# Patient Record
Sex: Female | Born: 1980 | Race: Black or African American | Hispanic: No | Smoking: Never smoker
Health system: Southern US, Community
[De-identification: ages and names within clinical notes are randomized; demographics above are authoritative.]

## PROBLEM LIST (undated history)

## (undated) DIAGNOSIS — Z789 Other specified health status: Secondary | ICD-10-CM

## (undated) HISTORY — PX: COLPOSCOPY: SHX161

## (undated) HISTORY — PX: WISDOM TOOTH EXTRACTION: SHX21

---
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
## 2001-12-04 ENCOUNTER — Inpatient Hospital Stay (HOSPITAL_COMMUNITY): Admission: AD | Admit: 2001-12-04 | Discharge: 2001-12-06 | Payer: Self-pay | Admitting: Obstetrics

## 2003-08-10 ENCOUNTER — Encounter (INDEPENDENT_AMBULATORY_CARE_PROVIDER_SITE_OTHER): Payer: Self-pay | Admitting: *Deleted

## 2003-08-10 LAB — CONVERTED CEMR LAB

## 2003-08-20 ENCOUNTER — Encounter: Admission: RE | Admit: 2003-08-20 | Discharge: 2003-08-20 | Payer: Self-pay | Admitting: Family Medicine

## 2003-08-20 ENCOUNTER — Other Ambulatory Visit: Admission: RE | Admit: 2003-08-20 | Discharge: 2003-08-20 | Payer: Self-pay | Admitting: Family Medicine

## 2005-02-25 ENCOUNTER — Other Ambulatory Visit: Admission: RE | Admit: 2005-02-25 | Discharge: 2005-02-25 | Payer: Self-pay | Admitting: Obstetrics and Gynecology

## 2006-05-14 ENCOUNTER — Emergency Department (HOSPITAL_COMMUNITY): Admission: EM | Admit: 2006-05-14 | Discharge: 2006-05-14 | Payer: Self-pay | Admitting: Emergency Medicine

## 2006-11-04 ENCOUNTER — Encounter (INDEPENDENT_AMBULATORY_CARE_PROVIDER_SITE_OTHER): Payer: Self-pay | Admitting: *Deleted

## 2006-12-14 ENCOUNTER — Inpatient Hospital Stay (HOSPITAL_COMMUNITY): Admission: AD | Admit: 2006-12-14 | Discharge: 2006-12-14 | Payer: Self-pay | Admitting: Obstetrics & Gynecology

## 2007-03-31 ENCOUNTER — Ambulatory Visit: Payer: Self-pay | Admitting: Gynecology

## 2007-03-31 ENCOUNTER — Other Ambulatory Visit: Admission: RE | Admit: 2007-03-31 | Discharge: 2007-03-31 | Payer: Self-pay | Admitting: Gynecology

## 2007-04-12 ENCOUNTER — Ambulatory Visit: Payer: Self-pay | Admitting: Obstetrics & Gynecology

## 2007-07-07 ENCOUNTER — Inpatient Hospital Stay (HOSPITAL_COMMUNITY): Admission: AD | Admit: 2007-07-07 | Discharge: 2007-07-07 | Payer: Self-pay | Admitting: Obstetrics and Gynecology

## 2007-08-24 ENCOUNTER — Ambulatory Visit: Payer: Self-pay | Admitting: Gynecology

## 2007-11-28 ENCOUNTER — Ambulatory Visit: Payer: Self-pay | Admitting: Obstetrics and Gynecology

## 2010-01-16 ENCOUNTER — Ambulatory Visit: Payer: Self-pay | Admitting: Obstetrics and Gynecology

## 2010-01-16 ENCOUNTER — Other Ambulatory Visit: Admission: RE | Admit: 2010-01-16 | Discharge: 2010-01-16 | Payer: Self-pay | Admitting: Obstetrics and Gynecology

## 2010-02-04 ENCOUNTER — Ambulatory Visit: Payer: Self-pay | Admitting: Obstetrics & Gynecology

## 2010-02-18 ENCOUNTER — Ambulatory Visit: Payer: Self-pay | Admitting: Obstetrics & Gynecology

## 2010-02-21 ENCOUNTER — Emergency Department (HOSPITAL_COMMUNITY): Admission: EM | Admit: 2010-02-21 | Discharge: 2010-02-21 | Payer: Self-pay | Admitting: Emergency Medicine

## 2010-06-07 ENCOUNTER — Emergency Department (HOSPITAL_COMMUNITY)
Admission: EM | Admit: 2010-06-07 | Discharge: 2010-06-07 | Payer: Self-pay | Source: Home / Self Care | Admitting: Emergency Medicine

## 2010-11-23 LAB — POCT URINALYSIS DIP (DEVICE)
Glucose, UA: NEGATIVE mg/dL
Nitrite: NEGATIVE
Specific Gravity, Urine: 1.015 (ref 1.005–1.030)
Urobilinogen, UA: 0.2 mg/dL (ref 0.0–1.0)

## 2010-11-23 LAB — POCT PREGNANCY, URINE: Preg Test, Ur: NEGATIVE

## 2010-11-24 LAB — POCT PREGNANCY, URINE: Preg Test, Ur: NEGATIVE

## 2011-02-08 LAB — ANTIBODY SCREEN: Antibody Screen: NEGATIVE

## 2011-02-08 LAB — HEPATITIS B SURFACE ANTIGEN: Hepatitis B Surface Ag: NEGATIVE

## 2011-02-08 LAB — ABO/RH

## 2011-02-08 LAB — RUBELLA ANTIBODY, IGM: Rubella: IMMUNE

## 2011-02-08 LAB — RPR: RPR: NONREACTIVE

## 2011-02-08 LAB — HIV ANTIBODY (ROUTINE TESTING W REFLEX): HIV: NONREACTIVE

## 2011-04-19 ENCOUNTER — Other Ambulatory Visit: Payer: Self-pay | Admitting: Family Medicine

## 2011-04-19 DIAGNOSIS — Z3689 Encounter for other specified antenatal screening: Secondary | ICD-10-CM

## 2011-05-03 ENCOUNTER — Ambulatory Visit (HOSPITAL_COMMUNITY)
Admission: RE | Admit: 2011-05-03 | Discharge: 2011-05-03 | Disposition: A | Discharge status: Home / Self Care | Payer: Medicaid Other | Source: Ambulatory Visit | Attending: Family Medicine | Admitting: Family Medicine

## 2011-05-03 DIAGNOSIS — Z363 Encounter for antenatal screening for malformations: Secondary | ICD-10-CM | POA: Insufficient documentation

## 2011-05-03 DIAGNOSIS — O358XX0 Maternal care for other (suspected) fetal abnormality and damage, not applicable or unspecified: Secondary | ICD-10-CM | POA: Insufficient documentation

## 2011-05-03 DIAGNOSIS — Z1389 Encounter for screening for other disorder: Secondary | ICD-10-CM | POA: Insufficient documentation

## 2011-05-03 DIAGNOSIS — Z3689 Encounter for other specified antenatal screening: Secondary | ICD-10-CM

## 2011-05-07 ENCOUNTER — Ambulatory Visit (HOSPITAL_COMMUNITY)
Admission: RE | Admit: 2011-05-07 | Discharge: 2011-05-07 | Disposition: A | Discharge status: Home / Self Care | Payer: Medicaid Other | Source: Ambulatory Visit | Attending: Family Medicine | Admitting: Family Medicine

## 2011-05-11 NOTE — Progress Notes (Signed)
Genetic Counseling  High-Risk Gestation Note  Appointment Date:  05/11/2011 Referred By: Morgan Bores, MD Date of Birth:  11-16-1980  I met with [name] for prenatal genetic counseling given a family history of Down syndrome.   Both family histories were reviewed and found to be contributory  for Down syndrome.  [name] reported two maternal half-nephews (her maternal half-brother's sons) with Down syndrome (full siblings to each other). The type of Down syndrome was not known by the patient. One passed away at 7 months from unspecified heart complications, and the other half-nephew with Down syndrome is now 29 years of age, reported to be doing well. [name] reports that he is followed by specialists at [facility]. We reviewed chromosomes, nondisjunction and translocations. She was counseled that approximately 95% of Down syndrome occurs by nondisjunction and is sporadic. This would not increase the risk for Down syndrome for her pregnancy above that of her age related risk if this were the cause for Down syndrome in her relatives. It was also discussed that approximately less than 5% of the time Down syndrome can be associated with a translocation. The patient was counseled that a balanced translocation can be inherited throughout generations, and if inherited, can increase the risk of having a child with Down syndrome. Without further information regarding chromosomal analysis for the patient's half-nephews, an accurate genetic risk cannot be calculated. We discussed that we are available to review medical records for her relatives, if desired, regarding their chromosome analysis in order to assess recurrence risk for relatives. [name] was also offered karyotyping for herself to assess for an the presence of a translocation. She declined at this time. The patient was encouraged to contact us should she obtain additional information regarding her nephews' Down syndrome. Further  genetic counseling is warranted if more information is obtained. Without further information regarding the provided family history, an accurate genetic risk cannot be calculated.   Further genetic counseling is warranted if more information is obtained.   We reviewed available screening options of Quad screen and ultrasound, both of which were previously performed for the patient. She understands that screening tests are used to modify a patient's a priori risk for aneuploidy, typically based on age.  This estimate provides a pregnancy specific risk assessment and is not diagnostic. We reviewed the results of the patient's Quad screen. [name] a priori risk for Down syndrome based on her age alone was reported as approximately 1 in 72,059. Her Quad screen was reported as screen negative. However, her age-related risk was not able to be adjusted given the reported family history of Down syndrome. This screen was also screen negative for trisomy 18 and open neural tube defects. We also reviewed the availability of diagnostic options including amniocentesis.  A risk of 1 in 200-300 was given for amniocentesis, the primary complication being spontaneous pregnancy loss.  We discussed the risks, limitations, and benefits of each.  It was discussed that not all birth defects or genetic conditions can be detected by these procedures.  After reviewing these options, [name] elected ultrasound and Quad screen only and declined amniocentesis.   She expressed that she is not interested in pursuing diagnostic testing at this time or in the future, given the associated risk of complications. She understands that ultrasound and Quad screen cannot rule out all birth defects or genetic syndromes.    The patient denied exposure to environmental toxins or chemical agents.  She denied the use of  alcohol, tobacco or street drugs.  She denied significant viral illnesses during the course of her pregnancy.  Her medical  and surgical history were contributory for a tooth abscess during pregnancy, for which she was treated with penicillin.     We counseled the patient for approximately 25 minutes regarding the above risks.     Morgan Braun Quianna Avery, MS, [facility] 05/11/2011

## 2011-05-31 LAB — POCT URINALYSIS DIP (DEVICE)
Bilirubin Urine: NEGATIVE
Glucose, UA: NEGATIVE
Nitrite: NEGATIVE
Specific Gravity, Urine: 1.025
pH: 6

## 2011-06-11 LAB — POCT URINALYSIS DIP (DEVICE)
Bilirubin Urine: NEGATIVE
Glucose, UA: NEGATIVE
Ketones, ur: NEGATIVE
Protein, ur: 30 — AB
Specific Gravity, Urine: 1.025
Urobilinogen, UA: 0.2
pH: 6

## 2011-06-16 LAB — URINALYSIS, ROUTINE W REFLEX MICROSCOPIC
Ketones, ur: NEGATIVE
Nitrite: NEGATIVE
Protein, ur: NEGATIVE

## 2011-06-21 LAB — POCT PREGNANCY, URINE: Operator id: 297281

## 2011-09-07 NOTE — L&D Delivery Note (Addendum)
Delivery Note At 10:43 PM a viable and healthy female was delivered via Vaginal, Spontaneous Delivery (Presentation:vertex ;  ).  APGAR: 8, 9; weight 8 lb 10 oz (3912 g).   Placenta status: Intact, Spontaneous.  Cord: 3 vessel with the following complications: .  Cord pH: not indicated  Delivery was complicated by a posterior compound hand that delivered spontaneously without intervention. Placenta delivered spontaneously and intact. Bleeding controlled.  Dr Candelaria Celeste, DO, present during delivery.  Anesthesia: None  Episiotomy: none Lacerations: 1st degree, no repair necessary Suture Repair: not indicated Est. Blood Loss (mL):   Mom to postpartum.  Baby to nursery-stable.  Cameron Proud 09/23/2011, 11:02 PM   I attended and precepted the delivery.  I agree with the above note. Candelaria Celeste JEHIEL 09/23/2011 11:46 PM

## 2011-09-09 LAB — STREP B DNA PROBE: GBS: POSITIVE

## 2011-09-23 ENCOUNTER — Encounter (HOSPITAL_COMMUNITY): Payer: Self-pay

## 2011-09-23 ENCOUNTER — Inpatient Hospital Stay (HOSPITAL_COMMUNITY)
Admission: AD | Admit: 2011-09-23 | Discharge: 2011-09-23 | Disposition: A | Discharge status: Home / Self Care | Source: Ambulatory Visit | Attending: Obstetrics & Gynecology | Admitting: Obstetrics & Gynecology

## 2011-09-23 ENCOUNTER — Encounter (HOSPITAL_COMMUNITY): Payer: Self-pay | Admitting: *Deleted

## 2011-09-23 ENCOUNTER — Inpatient Hospital Stay (HOSPITAL_COMMUNITY)
Admission: AD | Admit: 2011-09-23 | Discharge: 2011-09-25 | DRG: 775 | Disposition: A | Discharge status: Home / Self Care | Payer: Medicaid Other | Source: Ambulatory Visit | Attending: Obstetrics & Gynecology | Admitting: Obstetrics & Gynecology

## 2011-09-23 DIAGNOSIS — O9989 Other specified diseases and conditions complicating pregnancy, childbirth and the puerperium: Secondary | ICD-10-CM

## 2011-09-23 DIAGNOSIS — O328XX0 Maternal care for other malpresentation of fetus, not applicable or unspecified: Secondary | ICD-10-CM | POA: Diagnosis present

## 2011-09-23 DIAGNOSIS — Z22330 Carrier of Group B streptococcus: Secondary | ICD-10-CM

## 2011-09-23 DIAGNOSIS — O99892 Other specified diseases and conditions complicating childbirth: Principal | ICD-10-CM | POA: Diagnosis present

## 2011-09-23 DIAGNOSIS — O99891 Other specified diseases and conditions complicating pregnancy: Secondary | ICD-10-CM | POA: Insufficient documentation

## 2011-09-23 HISTORY — DX: Other specified health status: Z78.9

## 2011-09-23 LAB — CBC
Platelets: 198 10*3/uL (ref 150–400)
RDW: 14.3 % (ref 11.5–15.5)
WBC: 8.2 10*3/uL (ref 4.0–10.5)

## 2011-09-23 MED ORDER — IBUPROFEN 600 MG PO TABS
600.0000 mg | ORAL_TABLET | Freq: Four times a day (QID) | ORAL | Status: DC | PRN
  Administered 2011-09-23: 600 mg via ORAL
  Filled 2011-09-23: qty 1

## 2011-09-23 MED ORDER — LIDOCAINE HCL (PF) 1 % IJ SOLN: 30.0000 mL | INTRAMUSCULAR | Status: DC | PRN

## 2011-09-23 MED ORDER — FENTANYL CITRATE 0.05 MG/ML IJ SOLN: 100.0000 ug | INTRAMUSCULAR | Status: DC | PRN

## 2011-09-23 MED ORDER — LACTATED RINGERS IV SOLN
INTRAVENOUS | Status: DC
  Administered 2011-09-23: 23:00:00 via INTRAVENOUS

## 2011-09-23 MED ORDER — CITRIC ACID-SODIUM CITRATE 334-500 MG/5ML PO SOLN: 30.0000 mL | ORAL | Status: DC | PRN

## 2011-09-23 MED ORDER — OXYTOCIN BOLUS FROM INFUSION
500.0000 mL | Freq: Once | INTRAVENOUS | Status: AC
  Administered 2011-09-23: 500 mL via INTRAVENOUS

## 2011-09-23 MED ORDER — FLEET ENEMA 7-19 GM/118ML RE ENEM: 1.0000 | ENEMA | RECTAL | Status: DC | PRN

## 2011-09-23 MED ORDER — LACTATED RINGERS IV SOLN: 500.0000 mL | INTRAVENOUS | Status: DC | PRN

## 2011-09-23 MED ORDER — SODIUM CHLORIDE 0.9 % IV SOLN
2.0000 g | Freq: Once | INTRAVENOUS | Status: AC
  Filled 2011-09-23: qty 2000

## 2011-09-23 MED ORDER — OXYTOCIN 20 UNITS IN LACTATED RINGERS INFUSION - SIMPLE
125.0000 mL/h | Freq: Once | INTRAVENOUS | Status: AC
  Administered 2011-09-23: 125 mL/h via INTRAVENOUS

## 2011-09-23 MED ORDER — OXYCODONE-ACETAMINOPHEN 5-325 MG PO TABS: 2.0000 | ORAL_TABLET | ORAL | Status: DC | PRN

## 2011-09-23 MED ORDER — LIDOCAINE HCL (PF) 1 % IJ SOLN
INTRAMUSCULAR | Status: AC
  Filled 2011-09-23: qty 30

## 2011-09-23 MED ORDER — ONDANSETRON HCL 4 MG/2ML IJ SOLN: 4.0000 mg | Freq: Four times a day (QID) | INTRAMUSCULAR | Status: DC | PRN

## 2011-09-23 MED ORDER — SODIUM CHLORIDE 0.9 % IV SOLN
2.0000 g | Freq: Four times a day (QID) | INTRAVENOUS | Status: DC
  Administered 2011-09-23: 2 g via INTRAVENOUS
  Filled 2011-09-23 (×4): qty 2000

## 2011-09-23 MED ORDER — ACETAMINOPHEN 325 MG PO TABS: 650.0000 mg | ORAL_TABLET | ORAL | Status: DC | PRN

## 2011-09-23 MED ORDER — OXYTOCIN 20 UNITS IN LACTATED RINGERS INFUSION - SIMPLE
INTRAVENOUS | Status: AC
  Administered 2011-09-23: 125 mL/h via INTRAVENOUS
  Filled 2011-09-23: qty 1000

## 2011-09-23 MED ORDER — OXYTOCIN 10 UNIT/ML IJ SOLN
INTRAMUSCULAR | Status: AC
  Filled 2011-09-23: qty 2

## 2011-09-23 NOTE — Progress Notes (Signed)
Pt presents via EMS with contractions and ROM, clear fluid on perineum, SVE 8-9. G3P2. TO 168 per bed. Dr Carron Brazen notified to meet pt in room

## 2011-09-23 NOTE — H&P (Signed)
Morgan Hart is a 31 y.o. female presenting for SOL. Maternal Medical History:  Reason for admission: Reason for admission: contractions.  Contractions: Onset was 3-5 hours ago.   Frequency: regular.   Duration is approximately 30 seconds.    Fetal activity: Perceived fetal activity is normal.   Last perceived fetal movement was within the past 12 hours.      OB History    Grav Para Term Preterm Abortions TAB SAB Ect Mult Living   3 2 2       2      Past Medical History  Diagnosis Date  . No pertinent past medical history    Past Surgical History  Procedure Date  . Colposcopy    Family History: family history is not on file. Social History:  reports that she has never smoked. She does not have any smokeless tobacco history on file. She reports that she does not drink alcohol or use illicit drugs.  ROS  Dilation: 8.5 Station: 0 Exam by:: topp,RN There were no vitals taken for this visit. Maternal Exam:  Uterine Assessment: Contraction strength is firm.  Contraction duration is 30 seconds. Contraction frequency is regular.   Abdomen: Fundal height is term.   Estimated fetal weight is 8#.   Fetal presentation: vertex  Introitus: Ferning test: not done.  Nitrazine test: not done. Amniotic fluid character: not assessed.  Pelvis: adequate for delivery.   Cervix: Cervix evaluated by digital exam.     Fetal Exam Fetal Monitor Review: Baseline rate: 160.  Variability: moderate (6-25 bpm).   Pattern: no decelerations.    Fetal State Assessment: Category I - tracings are normal.     Physical Exam  Constitutional: She is oriented to person, place, and time. She appears well-developed and well-nourished.  HENT:  Head: Normocephalic and atraumatic.  Respiratory: Effort normal.  GI: She exhibits no distension and no mass. There is no tenderness. There is no rebound and no guarding.  Neurological: She is alert and oriented to person, place, and time.  Psychiatric:  She has a normal mood and affect. Her behavior is normal. Judgment and thought content normal.    Prenatal labs: ABO, Rh:  O+ Antibody:  neg Rubella:  immune RPR:   nonreactive HBsAg:   neg HIV:   nonreactive GBS:   positive 1Hr Gtt: 151 3hr GTT: 70, 155, 163, 129  Assessment/Plan: Precipitous delivery upon arrival in L&D, see delivery note.  Ampicillin 2g IV given prior to delivery.  Will transfer to postpartum after recovery.  Morgan Hart JEHIEL 09/23/2011, 10:35 PM

## 2011-09-23 NOTE — Plan of Care (Signed)
Problem: Consults Goal: Birthing Suites Patient Information Press F2 to bring up selections list  Outcome: Completed/Met Date Met:  09/23/11  Pt 37-[redacted] weeks EGA

## 2011-09-23 NOTE — ED Provider Notes (Signed)
Morgan Hart is a 31 y.o. year old G80P2002 female at [redacted]w[redacted]d weeks gestation who presents to [facility] reporting spontaneous rupture of membranes. She reports one episode of leaking a 7x7cm spot of fluid, unsure of color. No leaking since. She reports pos FM, mild irreg UC's and denies VB.   OB History    Grav Para Term Preterm Abortions TAB SAB Ect Mult Living   3 2 2       2      Past Medical History  Diagnosis Date  . No pertinent past medical history    Past Surgical History  Procedure Date  . Colposcopy     History   Social History  . Marital Status: Divorced    Spouse Name: N/A    Number of Children: N/A  . Years of Education: N/A   Occupational History  . Not on file.   Social History Main Topics  . Smoking status: Never Smoker   . Smokeless tobacco: Not on file  . Alcohol Use: No  . Drug Use: No  . Sexually Active: Yes   Other Topics Concern  . Not on file   Social History Narrative  . No narrative on file   No family history on file.  ROS: Otherwise neg  VSS, AF  PE: NAD, A&Ox4 Abd: Soft, NT Pelvic: Small amount of white discharge on perineum. Neg pool, neg fern on sterile spec exam. No rash.  Cervix; 3/70/-2, vtx.  FHR: 140-150, moderate variability, 15x15 accels, no decels Toco: Q10+, mild  Assessment: 1. Intact  Plan: 1. D/C home 2. Labor precautions, FKCs 3. F/U at [facility] as schleduled  Dorathy Kinsman 09/23/2011 02:58 PM

## 2011-09-23 NOTE — Progress Notes (Signed)
Gush of clear fluid this morning, no odor.  Lost mucous plug in shower.  No fluid since. irreg ctx's for past wk, have gotten more consistant today. Was 2 2/50 on Tues.

## 2011-09-24 MED ORDER — WITCH HAZEL-GLYCERIN EX PADS: 1.0000 "application " | MEDICATED_PAD | CUTANEOUS | Status: DC | PRN

## 2011-09-24 MED ORDER — LANOLIN HYDROUS EX OINT: TOPICAL_OINTMENT | CUTANEOUS | Status: DC | PRN

## 2011-09-24 MED ORDER — ONDANSETRON HCL 4 MG PO TABS: 4.0000 mg | ORAL_TABLET | ORAL | Status: DC | PRN

## 2011-09-24 MED ORDER — DIPHENHYDRAMINE HCL 25 MG PO CAPS: 25.0000 mg | ORAL_CAPSULE | Freq: Four times a day (QID) | ORAL | Status: DC | PRN

## 2011-09-24 MED ORDER — ONDANSETRON HCL 4 MG/2ML IJ SOLN: 4.0000 mg | INTRAMUSCULAR | Status: DC | PRN

## 2011-09-24 MED ORDER — PRENATAL MULTIVITAMIN CH
1.0000 | ORAL_TABLET | Freq: Every day | ORAL | Status: DC
  Administered 2011-09-24 – 2011-09-25 (×2): 1 via ORAL
  Filled 2011-09-24 (×2): qty 1

## 2011-09-24 MED ORDER — DIBUCAINE 1 % RE OINT: 1.0000 "application " | TOPICAL_OINTMENT | RECTAL | Status: DC | PRN

## 2011-09-24 MED ORDER — BENZOCAINE-MENTHOL 20-0.5 % EX AERO: 1.0000 "application " | INHALATION_SPRAY | CUTANEOUS | Status: DC | PRN

## 2011-09-24 MED ORDER — SENNOSIDES-DOCUSATE SODIUM 8.6-50 MG PO TABS
2.0000 | ORAL_TABLET | Freq: Every day | ORAL | Status: DC
  Administered 2011-09-24: 2 via ORAL

## 2011-09-24 MED ORDER — SIMETHICONE 80 MG PO CHEW: 80.0000 mg | CHEWABLE_TABLET | ORAL | Status: DC | PRN

## 2011-09-24 MED ORDER — OXYCODONE-ACETAMINOPHEN 5-325 MG PO TABS: 1.0000 | ORAL_TABLET | ORAL | Status: DC | PRN

## 2011-09-24 MED ORDER — ZOLPIDEM TARTRATE 5 MG PO TABS: 5.0000 mg | ORAL_TABLET | Freq: Every evening | ORAL | Status: DC | PRN

## 2011-09-24 MED ORDER — IBUPROFEN 600 MG PO TABS
600.0000 mg | ORAL_TABLET | Freq: Four times a day (QID) | ORAL | Status: DC
  Administered 2011-09-24 – 2011-09-25 (×5): 600 mg via ORAL
  Filled 2011-09-24 (×6): qty 1

## 2011-09-24 MED ORDER — TETANUS-DIPHTH-ACELL PERTUSSIS 5-2.5-18.5 LF-MCG/0.5 IM SUSP: 0.5000 mL | Freq: Once | INTRAMUSCULAR | Status: DC

## 2011-09-24 NOTE — Progress Notes (Signed)
UR Chart review completed.  

## 2011-09-24 NOTE — Discharge Summary (Signed)
Obstetric Discharge Summary Reason for Admission: onset of labor Prenatal Procedures: none Intrapartum Procedures: spontaneous vaginal delivery, GBS prophylaxis Postpartum Procedures: none Complications-Operative and Postpartum: 1st degree perineal laceration Hemoglobin  Date Value Range Status  09/23/2011 12.9  12.0-15.0 (g/dL) Final     HCT  Date Value Range Status  09/23/2011 37.7  36.0-46.0 (%) Final    Discharge Diagnoses: Term Pregnancy-delivered  Discharge Information: Date: 09/24/2011 Activity: unrestricted and pelvic rest Diet: routine Medications: PNV and Ibuprofen Condition: stable Instructions: refer to practice specific booklet Discharge to: home   Newborn Data: Live born female  Birth Weight: 8 lb 10 oz (3912 g) APGAR: 8, 9  Home with mother.  Cameron Proud 09/24/2011, 9:42 PM

## 2011-09-24 NOTE — Progress Notes (Signed)
Post Partum Day 1  Subjective: no complaints, up ad lib, voiding, tolerating PO and + flatus Breast feeding initiated, going well, mom to work with lactation team today Pt denies any fever or pain   Objective: Blood pressure 117/71, pulse 80, temperature 97.8 F (36.6 C), temperature source Oral, resp. rate 18, height 5\' 8"  (1.727 m), weight 99.338 kg (219 lb), SpO2 98.00%, unknown if currently breastfeeding.  Physical Exam:  General: alert, cooperative, appears stated age and no distress Lochia: appropriate Uterine Fundus: firm Incision: none DVT Evaluation: No evidence of DVT seen on physical exam. Negative Homan's sign. No cords or calf tenderness. No significant calf/ankle edema. Lungs CTA bilaterally Heart RRR no mgr   Basename 09/23/11 2310  HGB 12.9  HCT 37.7    Assessment/Plan: Plan for discharge tomorrow, Breastfeeding, Lactation consult and Contraception patient expresses desire for Mirena IUD Baby GBS (+) - anticipate DC tomorrow   LOS: 1 day   Morgan Hart 09/24/2011, 8:09 AM

## 2011-09-25 NOTE — Progress Notes (Signed)
Post Partum Day2 Subjective: no complaints, up ad lib, voiding, tolerating PO, + flatus and stooling Baby feeding well. Denies CP, SHOB, Leg pain.   Objective: Blood pressure 119/76, pulse 80, temperature 98.9 F (37.2 C), temperature source Oral, resp. rate 20, height 5\' 8"  (1.727 m), weight 99.338 kg (219 lb), SpO2 99.00%, unknown if currently breastfeeding.  Physical Exam:  General: alert, cooperative, appears stated age and no distress Lochia: appropriate Uterine Fundus: firm DVT Evaluation: No evidence of DVT seen on physical exam. Negative Homan's sign. No cords or calf tenderness. No significant calf/ankle edema. Lungs CTA bilaterally, no wheeze/rhonchi/rales Heart RRR no murmur, gallop, rub   Basename 09/23/11 2310  HGB 12.9  HCT 37.7    Assessment/Plan: Discharge home, Breastfeeding and Contraception Mirena at 6wk followup   LOS: 2 days   Cameron Proud 09/25/2011, 7:31 AM

## 2011-09-28 NOTE — ED Provider Notes (Signed)
Agree with above note.  LEGGETT,KELLY H. 09/28/2011 3:33 PM  

## 2014-07-08 ENCOUNTER — Encounter (HOSPITAL_COMMUNITY): Payer: Self-pay

## 2014-09-21 ENCOUNTER — Emergency Department (HOSPITAL_COMMUNITY)
Admission: EM | Admit: 2014-09-21 | Discharge: 2014-09-22 | Disposition: A | Discharge status: Home / Self Care | Payer: Medicaid Other | Attending: Emergency Medicine | Admitting: Emergency Medicine

## 2014-09-21 ENCOUNTER — Encounter (HOSPITAL_COMMUNITY): Payer: Self-pay

## 2014-09-21 DIAGNOSIS — Y998 Other external cause status: Secondary | ICD-10-CM | POA: Diagnosis not present

## 2014-09-21 DIAGNOSIS — Y92410 Unspecified street and highway as the place of occurrence of the external cause: Secondary | ICD-10-CM | POA: Insufficient documentation

## 2014-09-21 DIAGNOSIS — S3992XA Unspecified injury of lower back, initial encounter: Secondary | ICD-10-CM | POA: Diagnosis present

## 2014-09-21 DIAGNOSIS — T148XXA Other injury of unspecified body region, initial encounter: Secondary | ICD-10-CM

## 2014-09-21 DIAGNOSIS — S39012A Strain of muscle, fascia and tendon of lower back, initial encounter: Secondary | ICD-10-CM | POA: Insufficient documentation

## 2014-09-21 DIAGNOSIS — Z79899 Other long term (current) drug therapy: Secondary | ICD-10-CM | POA: Insufficient documentation

## 2014-09-21 DIAGNOSIS — Y9389 Activity, other specified: Secondary | ICD-10-CM | POA: Diagnosis not present
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
## 2014-09-22 MED ORDER — CYCLOBENZAPRINE HCL 10 MG PO TABS: 10.0000 mg | ORAL_TABLET | Freq: Two times a day (BID) | ORAL | Status: DC | PRN

## 2014-09-22 MED ORDER — IBUPROFEN 800 MG PO TABS: 800.0000 mg | ORAL_TABLET | Freq: Three times a day (TID) | ORAL | Status: DC
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
## 2015-02-05 ENCOUNTER — Encounter (HOSPITAL_COMMUNITY): Payer: Self-pay | Admitting: Emergency Medicine

## 2015-02-05 ENCOUNTER — Emergency Department (HOSPITAL_COMMUNITY)
Admission: EM | Admit: 2015-02-05 | Discharge: 2015-02-05 | Disposition: A | Discharge status: Home / Self Care | Payer: Medicaid Other | Attending: Emergency Medicine | Admitting: Emergency Medicine

## 2015-02-05 DIAGNOSIS — Z79899 Other long term (current) drug therapy: Secondary | ICD-10-CM | POA: Diagnosis not present

## 2015-02-05 DIAGNOSIS — Z791 Long term (current) use of non-steroidal anti-inflammatories (NSAID): Secondary | ICD-10-CM | POA: Diagnosis not present

## 2015-02-05 DIAGNOSIS — R9431 Abnormal electrocardiogram [ECG] [EKG]: Secondary | ICD-10-CM | POA: Diagnosis not present

## 2015-02-05 DIAGNOSIS — R200 Anesthesia of skin: Secondary | ICD-10-CM | POA: Diagnosis present

## 2015-02-05 LAB — BASIC METABOLIC PANEL
Anion gap: 9 (ref 5–15)
BUN: 10 mg/dL (ref 6–20)
CALCIUM: 9 mg/dL (ref 8.9–10.3)
CO2: 27 mmol/L (ref 22–32)
Chloride: 100 mmol/L — ABNORMAL LOW (ref 101–111)
Creatinine, Ser: 0.81 mg/dL (ref 0.44–1.00)
GFR calc Af Amer: >60 mL/min (ref >60)
GLUCOSE: 118 mg/dL — AB (ref 65–99)
POTASSIUM: 3.6 mmol/L (ref 3.5–5.1)
SODIUM: 136 mmol/L (ref 135–145)

## 2015-02-05 LAB — CBC
HCT: 35.8 % — ABNORMAL LOW (ref 36.0–46.0)
Hemoglobin: 11.8 g/dL — ABNORMAL LOW (ref 12.0–15.0)
MCH: 30.2 pg (ref 26.0–34.0)
MCHC: 33 g/dL (ref 30.0–36.0)
MCV: 91.6 fL (ref 78.0–100.0)
PLATELETS: 303 10*3/uL (ref 150–400)
RBC: 3.91 MIL/uL (ref 3.87–5.11)
RDW: 13.3 % (ref 11.5–15.5)
WBC: 6.4 10*3/uL (ref 4.0–10.5)

## 2015-02-05 LAB — I-STAT TROPONIN, ED: TROPONIN I, POC: 0 ng/mL (ref 0.00–0.08)

## 2015-02-05 NOTE — ED Provider Notes (Signed)
CSN: [id number]     Arrival date & time 02/05/15  1958 History   First MD Initiated Contact with Patient 02/05/15 2101     Chief Complaint  Patient presents with  . Numbness     (Consider location/radiation/quality/duration/timing/severity/associated sxs/prior Treatment) HPI  Morgan Hart is a 34 y.o. female with no second past medical history presenting today with abnormal EKG. Patient was in a clinical course and they're performing EKGs on each other. Her EKG read possible left atrial enlargement. In addition she's had left arm numbness for the past 3 days. Intermittently goes to her foot. So she presents emergency department for evaluation of the EKG. She denies any chest pain, shortness of breath, emesis, diaphoresis. Nothing makes the symptoms better or worse. She denies any history of ACS or blood clots in the past. She's had no recent long distance travel or surgeries. Patient has no further complaints.  10 Systems reviewed and are negative for acute change except as noted in the HPI.    Past Medical History  Diagnosis Date  . No pertinent past medical history    Past Surgical History  Procedure Laterality Date  . Colposcopy     History reviewed. No pertinent family history. History  Substance Use Topics  . Smoking status: Never Smoker   . Smokeless tobacco: Not on file  . Alcohol Use: No   OB History    Gravida Para Term Preterm AB TAB SAB Ectopic Multiple Living   Review of Systems    Allergies  Review of patient's allergies indicates no known allergies.  Home Medications   Prior to Admission medications   Medication Sig Start Date End Date Taking? Authorizing Provider  Multiple Vitamin (MULTI VITAMIN DAILY PO) Take 1 tablet by mouth daily.   Yes Historical Provider, MD  Probiotic Product (PROBIOTIC DAILY PO) Take 1 capsule by mouth daily.   Yes Historical Provider, MD  cyclobenzaprine (FLEXERIL) 10 MG tablet Take 1 tablet (10 mg total)  by mouth 2 (two) times daily as needed for muscle spasms. Patient not taking: Reported on 02/05/2015 09/22/14   Elpidio Anis, PA-C  ibuprofen (ADVIL,MOTRIN) 800 MG tablet Take 1 tablet (800 mg total) by mouth 3 (three) times daily. Patient not taking: Reported on 02/05/2015 09/22/14   Elpidio Anis, PA-C   BP 130/83 mmHg  Pulse 86  Temp(Src) 98.2 F (36.8 C) (Oral)  Resp 22  SpO2 100%  LMP 01/29/2015 Physical Exam  Constitutional: She is oriented to person, place, and time. She appears well-developed and well-nourished. No distress.  HENT:  Head: Normocephalic and atraumatic.  Nose: Nose normal.  Mouth/Throat: Oropharynx is clear and moist. No oropharyngeal exudate.  Eyes: Conjunctivae and EOM are normal. Pupils are equal, round, and reactive to light. No scleral icterus.  Neck: Normal range of motion. Neck supple. No JVD present. No tracheal deviation present. No thyromegaly present.  Cardiovascular: Normal rate, regular rhythm and normal heart sounds.  Exam reveals no gallop and no friction rub.   No murmur heard. Pulmonary/Chest: Effort normal and breath sounds normal. No respiratory distress. She has no wheezes. She exhibits no tenderness.  Abdominal: Soft. Bowel sounds are normal. She exhibits no distension and no mass. There is no tenderness. There is no rebound and no guarding.  Musculoskeletal: Normal range of motion. She exhibits no edema or tenderness.  Lymphadenopathy:    She has no cervical adenopathy.  Neurological: She is alert  and oriented to person, place, and time. No cranial nerve deficit. She exhibits normal muscle tone.  Skin: Skin is warm and dry. No rash noted. She is not diaphoretic. No erythema. No pallor.  Nursing note and vitals reviewed.   ED Course  Procedures (including critical care time) Labs Review Labs Reviewed  CBC - Abnormal; Notable for the following:    Hemoglobin 11.8 (*)    HCT 35.8 (*)    All other components within normal limits  BASIC  METABOLIC PANEL - Abnormal; Notable for the following:    Chloride 100 (*)    Glucose, Bld 118 (*)    All other components within normal limits  I-STAT TROPOININ, ED    Imaging Review No results found.   EKG Interpretation   Date/Time:  Wednesday February 05 2015 20:05:12 EDT Ventricular Rate:  83 PR Interval:  165 QRS Duration: 79 QT Interval:  359 QTC Calculation: 422 R Axis:   62 Text Interpretation:  Sinus rhythm Probable left atrial enlargement  Confirmed by Erroll Lunani, Albie Arizpe Ayokunle [phone]) on 02/05/2015 9:11:30 PM      MDM   Final diagnoses:  None    Patient presents emergency department for evaluation of her EKG. Repeat EKG here does show probable left atrial enlargement as well. EKG findings were explained to the patient in detail. Also EKG she brought and shows similar signs. Will provide neurology follow-up for numbness of left upper extremity. I do not believe these are related.  Heart score is less than 3, patient is PERC negative. She is low risk for cardiac event. Patient overall appears well in no acute distress, her vital signs remain within her normal limits and she is safe for discharge.    Tomasita CrumbleAdeleke Tereasa Yilmaz, MD 02/05/15 2210

## 2015-02-05 NOTE — Discharge Instructions (Signed)
Electrocardiography Morgan Hart, your EKG shows a normal variant. See cardiology within 3 days for close follow-up. You're also given neurology for follow-up for your numbness within 3 days. If any symptoms worsen come back to the emergency department immediately. Thank you. Electrocardiography is a test to check the heart. It looks at how your heart beats. It is done if:   You are having a heart attack.  You may have had a heart attack in the past.  You are having a health checkup. PROCEDURE  This test is easy and painless.  You will remove your clothes from the waist up, wear a hospital gown, and lie down on your back.  Small round pads (electrodes) will be placed on your chest, arms, and legs. The round pads are attached to wires that go to a machine. The machine records the electrical activity of your heart.  You will be asked to relax and lie very still. AFTER THE PROCEDURE  If the test was done as part of a routine exam, you can go back to your normal activity as told by your doctor.  Your results will be looked at by a heart doctor (cardiologist).  Ask when your test results will be ready. Make sure you get your test results. Document Released: 08/05/2008 Document Revised: 01/07/2014 Document Reviewed: 01/03/2012 [facility] Patient Information 2015 [employer] [place]. This information is not intended to replace advice given to you by your health care provider. Make sure you discuss any questions you have with your health care provider.

## 2015-02-05 NOTE — ED Notes (Signed)
Pt states she was at school today and they were practicing EKG's on each other and her said she had some left atrial enlargement. States it made her nervous because for the past three days her left arm and leg have been intermittently numb and tingling. With her left arm being more weak than her leg. Denies chest pain/SOB/N/V/D/fever or chills.

## 2015-02-05 NOTE — ED Notes (Signed)
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

## 2015-03-21 ENCOUNTER — Ambulatory Visit (INDEPENDENT_AMBULATORY_CARE_PROVIDER_SITE_OTHER): Admitting: Neurology

## 2015-03-21 ENCOUNTER — Encounter: Payer: Self-pay | Admitting: Neurology

## 2015-03-21 VITALS — BP 110/80 | HR 82 | Wt 193.4 lb

## 2015-03-21 DIAGNOSIS — M542 Cervicalgia: Secondary | ICD-10-CM

## 2015-03-21 DIAGNOSIS — R200 Anesthesia of skin: Secondary | ICD-10-CM | POA: Diagnosis not present

## 2015-03-21 NOTE — Patient Instructions (Addendum)
1.  MRI brain wwo contrast and cervical spine wo contrast 2.  If no improvement of your symptoms, we may consider electrodiagnostic testing of the left side 3. We will call you with the results

## 2015-03-21 NOTE — Progress Notes (Signed)
Note sent
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
## 2015-03-25 ENCOUNTER — Other Ambulatory Visit: Payer: Self-pay | Admitting: Neurology

## 2015-03-31 ENCOUNTER — Telehealth: Payer: Self-pay | Admitting: Neurology

## 2015-03-31 ENCOUNTER — Other Ambulatory Visit

## 2015-03-31 ENCOUNTER — Ambulatory Visit
Admission: RE | Admit: 2015-03-31 | Discharge: 2015-03-31 | Disposition: A | Discharge status: Home / Self Care | Payer: Medicaid Other | Source: Ambulatory Visit | Attending: Neurology | Admitting: Neurology

## 2015-03-31 ENCOUNTER — Telehealth: Payer: Self-pay | Admitting: *Deleted

## 2015-03-31 DIAGNOSIS — R200 Anesthesia of skin: Secondary | ICD-10-CM

## 2015-03-31 DIAGNOSIS — M542 Cervicalgia: Secondary | ICD-10-CM

## 2015-03-31 MED ORDER — GADOBENATE DIMEGLUMINE 529 MG/ML IV SOLN
18.0000 mL | Freq: Once | INTRAVENOUS | Status: AC | PRN
  Administered 2015-03-31: 18 mL via INTRAVENOUS

## 2015-03-31 NOTE — Telephone Encounter (Signed)
Left message for patient to let her know that insurance will not cover the MRI C-spine so I cancelled it.  The MRI brain is still scheduled.

## 2015-03-31 NOTE — Telephone Encounter (Signed)
Pt called in regards to her insurance for her MRI and said she no longer has [employer]are, she only has Medicaid/Dawn CB# [phone]

## 2015-04-01 NOTE — Telephone Encounter (Signed)
Noted  

## 2016-11-01 ENCOUNTER — Other Ambulatory Visit (HOSPITAL_COMMUNITY): Payer: Self-pay | Admitting: *Deleted

## 2016-11-01 DIAGNOSIS — N6320 Unspecified lump in the left breast, unspecified quadrant: Secondary | ICD-10-CM

## 2016-11-23 ENCOUNTER — Ambulatory Visit (HOSPITAL_COMMUNITY)
Admission: RE | Admit: 2016-11-23 | Discharge: 2016-11-23 | Disposition: A | Discharge status: Home / Self Care | Payer: Self-pay | Source: Ambulatory Visit | Attending: Obstetrics and Gynecology | Admitting: Obstetrics and Gynecology

## 2016-11-23 ENCOUNTER — Ambulatory Visit
Admission: RE | Admit: 2016-11-23 | Discharge: 2016-11-23 | Disposition: A | Discharge status: Home / Self Care | Payer: No Typology Code available for payment source | Source: Ambulatory Visit | Attending: Obstetrics and Gynecology | Admitting: Obstetrics and Gynecology

## 2016-11-23 ENCOUNTER — Other Ambulatory Visit (HOSPITAL_COMMUNITY): Payer: Self-pay | Admitting: *Deleted

## 2016-11-23 ENCOUNTER — Encounter (HOSPITAL_COMMUNITY): Payer: Self-pay

## 2016-11-23 ENCOUNTER — Other Ambulatory Visit (HOSPITAL_COMMUNITY): Payer: Self-pay | Admitting: Obstetrics and Gynecology

## 2016-11-23 VITALS — BP 112/72 | Temp 98.6°F | Ht 68.0 in | Wt 193.6 lb

## 2016-11-23 DIAGNOSIS — N6320 Unspecified lump in the left breast, unspecified quadrant: Secondary | ICD-10-CM

## 2016-11-23 DIAGNOSIS — N644 Mastodynia: Secondary | ICD-10-CM

## 2016-11-23 DIAGNOSIS — R2231 Localized swelling, mass and lump, right upper limb: Secondary | ICD-10-CM

## 2016-11-23 DIAGNOSIS — Z1239 Encounter for other screening for malignant neoplasm of breast: Secondary | ICD-10-CM

## 2016-11-23 NOTE — Progress Notes (Addendum)
Complaints of a lump underneath her left breast that has increased in size and pain in her upper left breast. Patient states the pain comes and goes. Patient rates the pain at a 5 out of 10.  Pap Smear: Pap smear not completed today. [facility] and normal. Per patient has a history of an abnormal Pap smear around 2008 or 2009 that a colposcopy was completed for follow up. Patient states all Pap smears have been normal since colposcopy. No Pap smear results are in EPIC.  Physical exam: Breasts Breasts symmetrical. No skin abnormalities bilateral breasts. No nipple retraction bilateral breasts. No nipple discharge bilateral breasts. No lymphadenopathy. No lumps palpated bilateral breasts. Palpated a fatty mass underneath left breast. Patient complained of left upper breast tenderness on exam. [facility] and possible left breast ultrasound. Appointment scheduled for Tuesday, November 23, 2016 at 1450.        Pelvic/Bimanual No Pap smear completed today since last Pap smear was 07/23/2016. Pap smear not indicated per BCCCP guidelines.   Smoking History: Patient has never smoked.  Patient Navigation: Patient education provided. [facility] services [facility] through [facility].

## 2016-11-23 NOTE — Patient Instructions (Addendum)
Explained breast self awareness with Saraphina Gruenwald. Patient did not need a Pap smear today due to last Pap smear was 07/23/2016. Let her know BCCCP will cover Pap smears every since patient has had at least three normal Pap smears since abnormal Pap smear. [facility] and possible left breast ultrasound. Appointment scheduled for Tuesday, November 23, 2016 at 1450.   Deby Thibault verbalized understanding.  Marshall Roehrich, Kathaleen Maserhristine Poll, RN 3:02 PM

## 2016-11-25 NOTE — Addendum Note (Signed)
Encounter addended by: Priscille Heidelberghristine P Brannock, RN on: 11/25/2016  9:57 AM<BR>    Actions taken: Follow-up modified, Sign clinical note

## 2016-11-30 ENCOUNTER — Encounter (HOSPITAL_COMMUNITY): Payer: Self-pay | Admitting: *Deleted

## 2019-01-30 ENCOUNTER — Encounter (HOSPITAL_COMMUNITY): Payer: Self-pay

## 2019-01-30 ENCOUNTER — Other Ambulatory Visit: Payer: Self-pay

## 2019-01-30 ENCOUNTER — Emergency Department (HOSPITAL_COMMUNITY): Payer: Self-pay

## 2019-01-30 ENCOUNTER — Emergency Department (HOSPITAL_COMMUNITY)
Admission: EM | Admit: 2019-01-30 | Discharge: 2019-01-30 | Disposition: A | Discharge status: Home / Self Care | Payer: Self-pay | Attending: Emergency Medicine | Admitting: Emergency Medicine

## 2019-01-30 DIAGNOSIS — Z79899 Other long term (current) drug therapy: Secondary | ICD-10-CM | POA: Insufficient documentation

## 2019-01-30 DIAGNOSIS — R0789 Other chest pain: Secondary | ICD-10-CM | POA: Insufficient documentation

## 2019-01-30 LAB — BASIC METABOLIC PANEL
Anion gap: 5 (ref 5–15)
BUN: 10 mg/dL (ref 6–20)
CO2: 28 mmol/L (ref 22–32)
Calcium: 8.9 mg/dL (ref 8.9–10.3)
Chloride: 105 mmol/L (ref 98–111)
Creatinine, Ser: 1.07 mg/dL — ABNORMAL HIGH (ref 0.44–1.00)
GFR calc Af Amer: >60 mL/min (ref >60)
GFR calc non Af Amer: >60 mL/min (ref >60)
Glucose, Bld: 83 mg/dL (ref 70–99)
Potassium: 3.4 mmol/L — ABNORMAL LOW (ref 3.5–5.1)
Sodium: 138 mmol/L (ref 135–145)

## 2019-01-30 LAB — CBC
HCT: 36 % (ref 36.0–46.0)
Hemoglobin: 11.5 g/dL — ABNORMAL LOW (ref 12.0–15.0)
MCH: 29.7 pg (ref 26.0–34.0)
MCHC: 31.9 g/dL (ref 30.0–36.0)
MCV: 93 fL (ref 80.0–100.0)
Platelets: 340 10*3/uL (ref 150–400)
RBC: 3.87 MIL/uL (ref 3.87–5.11)
RDW: 14.2 % (ref 11.5–15.5)
WBC: 6.2 10*3/uL (ref 4.0–10.5)
nRBC: 0 % (ref 0.0–0.2)

## 2019-01-30 LAB — TROPONIN I: Troponin I: <0.03 ng/mL (ref <0.03)

## 2019-01-30 LAB — D-DIMER, QUANTITATIVE: D-Dimer, Quant: 0.77 ug/mL-FEU — ABNORMAL HIGH (ref 0.00–0.50)

## 2019-01-30 MED ORDER — SODIUM CHLORIDE (PF) 0.9 % IJ SOLN
INTRAMUSCULAR | Status: AC
  Filled 2019-01-30: qty 50

## 2019-01-30 MED ORDER — IOHEXOL 350 MG/ML SOLN
100.0000 mL | Freq: Once | INTRAVENOUS | Status: AC | PRN
  Administered 2019-01-30: 100 mL via INTRAVENOUS

## 2019-01-30 MED ORDER — KETOROLAC TROMETHAMINE 30 MG/ML IJ SOLN
30.0000 mg | Freq: Once | INTRAMUSCULAR | Status: DC
  Filled 2019-01-30: qty 1

## 2019-01-30 MED ORDER — SODIUM CHLORIDE 0.9% FLUSH
3.0000 mL | Freq: Once | INTRAVENOUS | Status: AC
  Administered 2019-01-30: 3 mL via INTRAVENOUS

## 2019-01-30 MED ORDER — IBUPROFEN 600 MG PO TABS: 600.0000 mg | ORAL_TABLET | Freq: Four times a day (QID) | ORAL | 0 refills | Status: AC | PRN

## 2019-01-30 NOTE — ED Notes (Signed)
Patient states she is not having any pain. Patient states that she feels fine.

## 2019-01-30 NOTE — ED Triage Notes (Signed)
Patient states she began having mid sternal chest pain last night and again at 0200 today. Chest pain radiated to the upper back as well.Patient states she got up and had SOB as well. Patient states she called the Tele physician and was told to come to the ED to r/o PE.  patient denies any SOB and states that her mid chest pain is 3/10 at this time.

## 2019-01-30 NOTE — ED Provider Notes (Signed)
[facility]-EMERGENCY DEPT Provider Note   CSN: [id number] Arrival date & time: 01/30/19  1608    History   Chief Complaint Chief Complaint  Patient presents with   Chest Pain   Back Pain    HPI Morgan Hart is a 38 y.o. female.     HPI Patient states she began having central chest pain that radiates to her thoracic back which started around midnight.  States the pain improves with and worsens when she lies flat.  Has some associated shortness of breath.  She denies any cough, fever or chills.  States she still has some central heaviness but denies any sharp pain currently.  No new lower extremity swelling or pain.  No recent extended travel or immobilization. Past Medical History:  Diagnosis Date   No pertinent past medical history     There are no active problems to display for this patient.   Past Surgical History:  Procedure Laterality Date   COLPOSCOPY     WISDOM TOOTH EXTRACTION       OB History    Gravida  3   Para  3   Term  3   Preterm      AB      Living  3     SAB      TAB      Ectopic      Multiple      Live Births  1            Home Medications    Prior to Admission medications   Medication Sig Start Date End Date Taking? Authorizing Provider  [facility] EVENING PRIMROSE OIL PO Take 1 capsule by mouth daily.   Yes [provider]  Flaxseed Oil OIL Take 15 mLs by mouth at bedtime.   Yes [provider]  Multiple Vitamin (MULTI VITAMIN DAILY PO) Take 1 tablet by mouth daily.   Yes [provider]  Probiotic Product (PROBIOTIC DAILY PO) Take 1 capsule by mouth daily.   Yes [provider]  ibuprofen (ADVIL) 600 MG tablet Take 1 tablet (600 mg total) by mouth every 6 (six) hours as needed. 01/30/19   Loren RacerYelverton, Melissa Tomaselli, MD    Family History Family History  Problem Relation Age of Onset   Intracerebral hemorrhage Mother        Deceased, 4445   Hypertension Mother     Healthy Father    Healthy Brother    Healthy Sister    Healthy Son    Healthy Daughter     Social History Social History   Tobacco Use   Smoking status: Never Smoker   Smokeless tobacco: Never Used  Substance Use Topics   Alcohol use: Yes    Alcohol/week: 0.0 standard drinks    Comment: rarely   Drug use: No     Allergies   Patient has no known allergies.   Review of Systems Review of Systems  Constitutional: Negative for chills and fever.  HENT: Negative for sinus pressure and sore throat.   Eyes: Negative for visual disturbance.  Respiratory: Positive for chest tightness and shortness of breath. Negative for cough and wheezing.   Cardiovascular: Positive for chest pain. Negative for palpitations and leg swelling.  Gastrointestinal: Negative for abdominal pain, constipation, diarrhea, nausea and vomiting.  Genitourinary: Negative for flank pain and frequency.  Musculoskeletal: Positive for back pain. Negative for joint swelling and myalgias.  Skin: Negative for rash and wound.  Neurological: Negative for dizziness, weakness,  light-headedness, numbness and headaches.  All other systems reviewed and are negative.    Physical Exam Updated Vital Signs BP 112/73 (BP Location: Right Arm)    Pulse 71    Temp 98.5 F (36.9 C) (Oral)    Resp 18    Ht  (1.727 m)    Wt 92.5 kg    LMP 01/30/2019    SpO2 100%    BMI 31.02 kg/m   Physical Exam Vitals signs and nursing note reviewed.  Constitutional:      Appearance: Normal appearance. She is well-developed.  HENT:     Head: Normocephalic and atraumatic.     Nose: Nose normal.     Mouth/Throat:     Mouth: Mucous membranes are moist.  Eyes:     Pupils: Pupils are equal, round, and reactive to light.  Neck:     Musculoskeletal: Normal range of motion and neck supple. No neck rigidity or muscular tenderness.  Cardiovascular:     Rate and Rhythm: Normal rate and regular rhythm.     Heart sounds: No murmur. No  gallop.   Pulmonary:     Effort: No respiratory distress.     Breath sounds: Normal breath sounds. No stridor. No wheezing, rhonchi or rales.     Comments: Midsternal tenderness to palpation.  No crepitance or deformity. Chest:     Chest wall: Tenderness present.  Abdominal:     General: Bowel sounds are normal.     Palpations: Abdomen is soft.     Tenderness: There is no abdominal tenderness. There is no guarding or rebound.  Musculoskeletal: Normal range of motion.        General: No swelling, tenderness, deformity or signs of injury.     Right lower leg: No edema.     Left lower leg: No edema.     Comments: No midline thoracic or lumbar tenderness.  No CVA tenderness.  Distal pulses are 2+.  Lymphadenopathy:     Cervical: No cervical adenopathy.  Skin:    General: Skin is warm and dry.     Findings: No erythema or rash.  Neurological:     General: No focal deficit present.     Mental Status: She is alert and oriented to person, place, and time.     Comments: 5/5 motor in all extremities.  Sensation intact.  Psychiatric:        Mood and Affect: Mood normal.        Behavior: Behavior normal.      ED Treatments / Results  Labs (all labs ordered are listed, but only abnormal results are displayed) Labs Reviewed  BASIC METABOLIC PANEL - Abnormal; Notable for the following components:      Result Value   Potassium 3.4 (*)    Creatinine, Ser 1.07 (*)    All other components within normal limits  CBC - Abnormal; Notable for the following components:   Hemoglobin 11.5 (*)    All other components within normal limits  D-DIMER, QUANTITATIVE (NOT AT [facility]) - Abnormal; Notable for the following components:   D-Dimer, Quant 0.77 (*)    All other components within normal limits  TROPONIN I  I-STAT BETA HCG BLOOD, ED ([facility], [facility], [facility] ONLY)    EKG EKG Interpretation  Date/Time:  Tuesday Jan 30 2019 16:24:17 EDT Ventricular Rate:  77 PR Interval:    QRS Duration: 80 QT  Interval:  357 QTC Calculation: 404 R Axis:   33 Text Interpretation:  Sinus rhythm  Borderline T abnormalities, inferior leads Confirmed by Loren Racer ([id number]) on 01/30/2019 4:37:14 PM   Radiology Dg Chest 2 View  Result Date: 01/30/2019 CLINICAL DATA:  Chest pain EXAM: CHEST - 2 VIEW COMPARISON:  None. FINDINGS: Lungs are clear. Heart size and pulmonary vascularity are normal. No adenopathy. No pneumothorax. There is slight upper lumbar levoscoliosis. IMPRESSION: No edema or consolidation.  Heart size within normal limits. Electronically Signed   By: Bretta Bang III M.D.   On: 01/30/2019 16:57   Ct Angio Chest Pe W And/or Wo Contrast  Result Date: 01/30/2019 CLINICAL DATA:  Substernal chest pain and shortness of breath for several hours EXAM: CT ANGIOGRAPHY CHEST WITH CONTRAST TECHNIQUE: Multidetector CT imaging of the chest was performed using the standard protocol during bolus administration of intravenous contrast. Multiplanar CT image reconstructions and MIPs were obtained to evaluate the vascular anatomy. CONTRAST:  OMNIPAQUE 350 COMPARISON:  Chest x-ray from earlier in the same day. FINDINGS: Cardiovascular: Thoracic aorta and its branches are within normal limits. No atherosclerotic changes or aneurysmal dilatation is noted. No dissection is seen. No cardiac enlargement is noted. Pulmonary artery shows a normal branching pattern. No filling defect is identified to suggest pulmonary embolism. Mediastinum/Nodes: Thoracic inlet is within normal limits. No hilar or mediastinal adenopathy is noted. The esophagus is within normal limits. Lungs/Pleura: Lungs are well aerated bilaterally. No focal infiltrate or sizable effusion is noted. Upper Abdomen: Visualized upper abdomen demonstrates a cystic lesion within the anterior aspect of the spleen measuring 5.7 cm in greatest dimension. Some peripheral calcifications are noted. No other focal abnormality is noted. Musculoskeletal: No chest  wall abnormality. No acute or significant osseous findings. Review of the MIP images confirms the above findings. IMPRESSION: No evidence of pulmonary emboli. Lungs are clear bilaterally. Cystic lesion within the spleen with peripheral calcifications likely representing a mildly complicated cyst. Electronically Signed   By: Alcide Clever M.D.   On: 01/30/2019 19:57    Procedures Procedures (including critical care time)  Medications Ordered in ED Medications  ketorolac (TORADOL) 30 MG/ML injection 30 mg (30 mg Intravenous Refused 01/30/19 2014)  sodium chloride flush (NS) 0.9 % injection 3 mL (3 mLs Intravenous Given 01/30/19 1841)  iohexol (OMNIPAQUE) 350 MG/ML injection 100 mL (100 mLs Intravenous Contrast Given 01/30/19 1933)     Initial Impression / Assessment and Plan / ED Course  I have reviewed the triage vital signs and the nursing notes.  Pertinent labs & imaging results that were available during my care of the patient were reviewed by me and considered in my medical decision making (see chart for details).        Elevation in d-dimer.  CT angios chest without PE or other concerning findings.  Given the chest pain is reproduced with palpation suspect chest wall etiology such as costochondritis.  Will treat with NSAIDs.  Return precautions given.  Final Clinical Impressions(s) / ED Diagnoses   Final diagnoses:  Chest wall pain    ED Discharge Orders         Ordered    ibuprofen (ADVIL) 600 MG tablet  Every 6 hours PRN     01/30/19 2016           Loren Racer, MD 01/30/19 2019

## 2019-08-22 IMAGING — CT CT ANGIOGRAPHY CHEST
2 of 6 series · 18 of 46 positions shown · IV contrast (OMNIPAQUE)
Comparison: Chest x-ray from earlier in the same day.

CLINICAL DATA: Substernal chest pain and shortness of breath for
several hours

EXAM:
CT ANGIOGRAPHY CHEST WITH CONTRAST
TECHNIQUE: Multidetector CT imaging of the chest was performed using the
standard protocol during bolus administration of intravenous
contrast. Multiplanar CT image reconstructions and MIPs were
obtained to evaluate the vascular anatomy.
CONTRAST:  100mL OMNIPAQUE 350

[Series 5: thins · axial · 0.60mm/px · z∈[+1300,+1590]mm · 15 of 318 slices shown]
[im 14/318  lung]
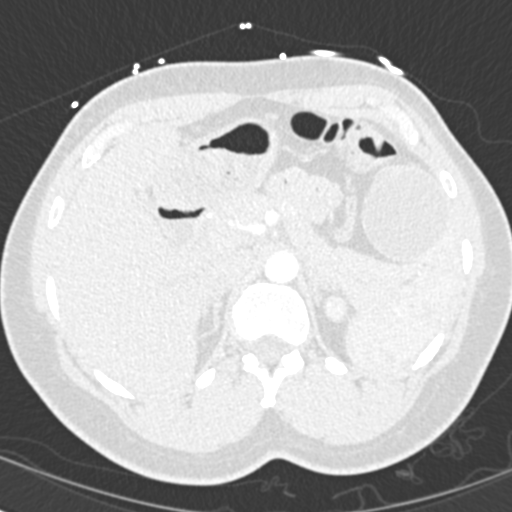
[im 42/318  soft-tissue]
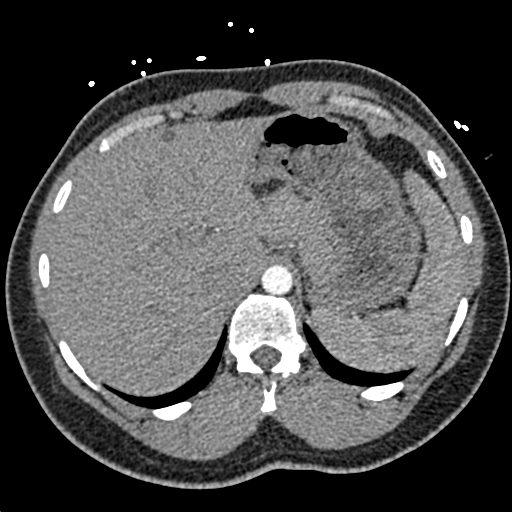
[im 56/318  lung]
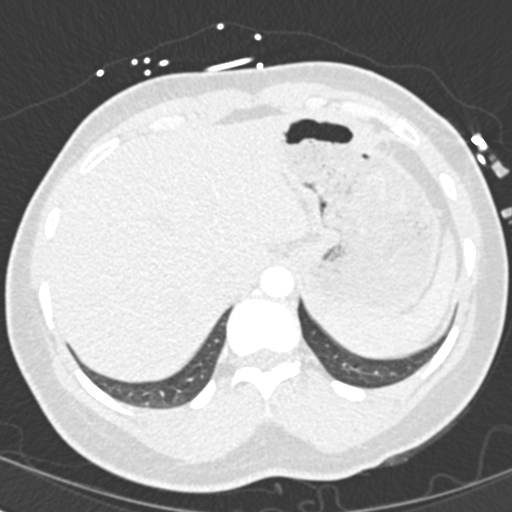
[im 83/318  soft-tissue]
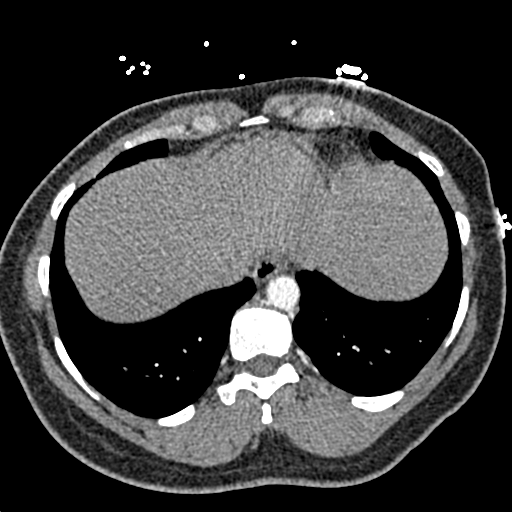
[im 97/318  lung]
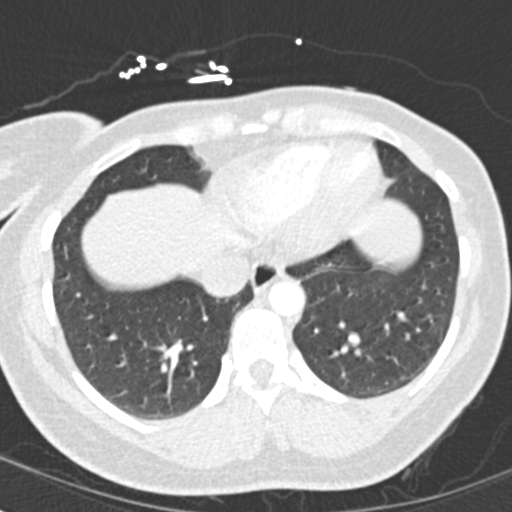
[im 125/318  soft-tissue]
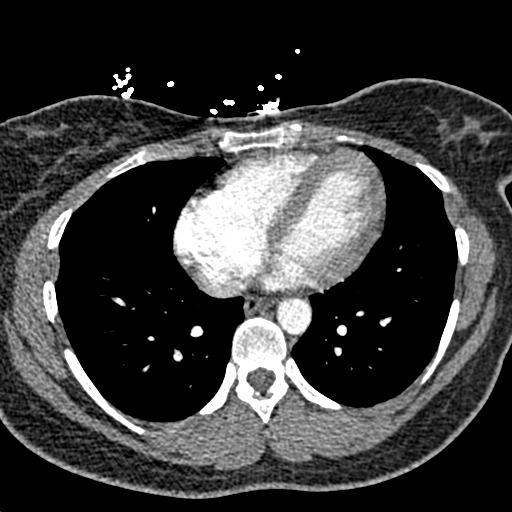
[im 138/318  lung]
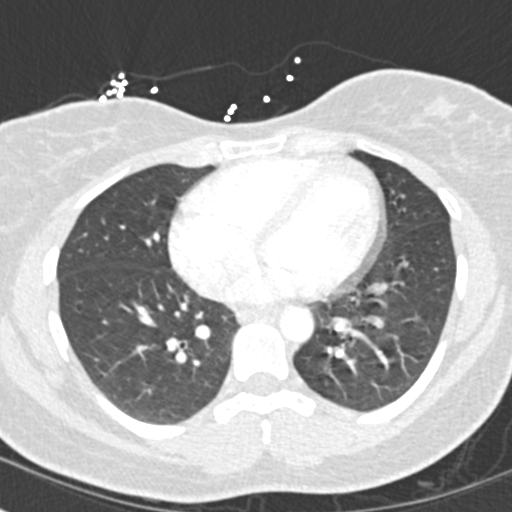
[im 166/318  soft-tissue]
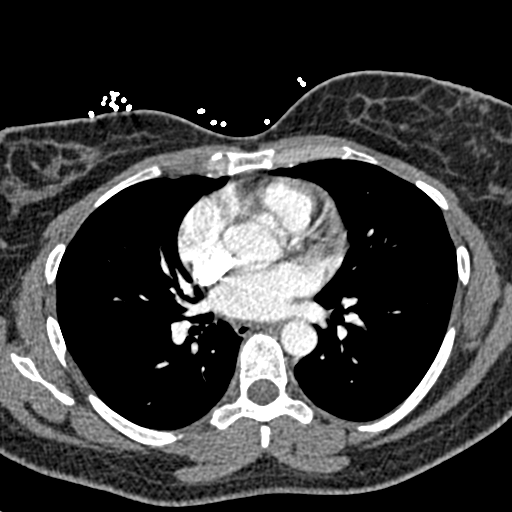
[im 180/318  lung]
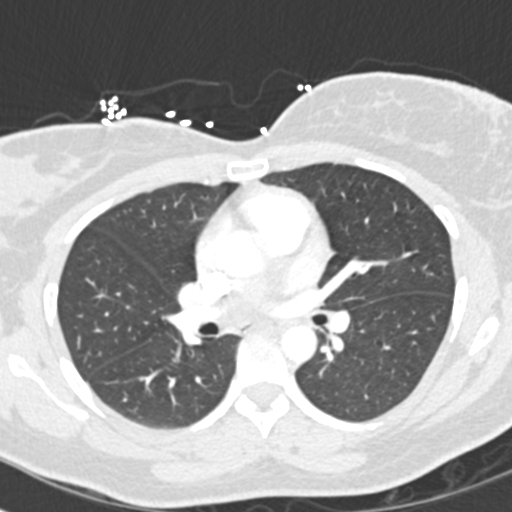
[im 193/318  soft-tissue]
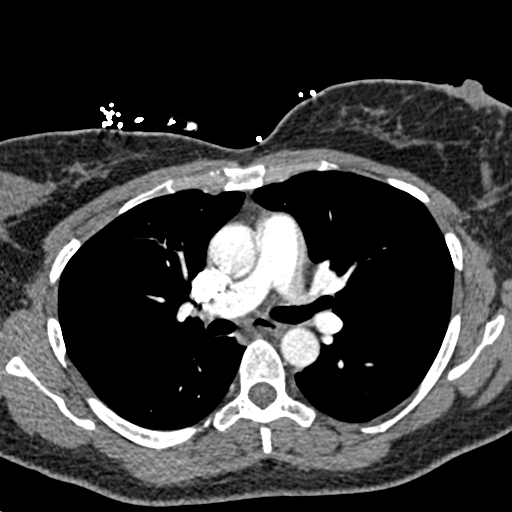
[im 221/318  lung]
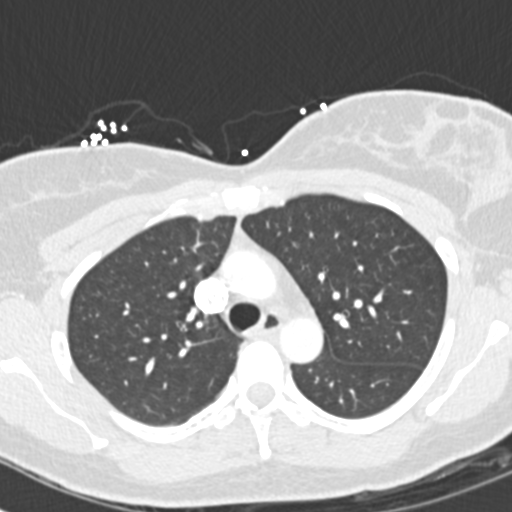
[im 235/318  soft-tissue]
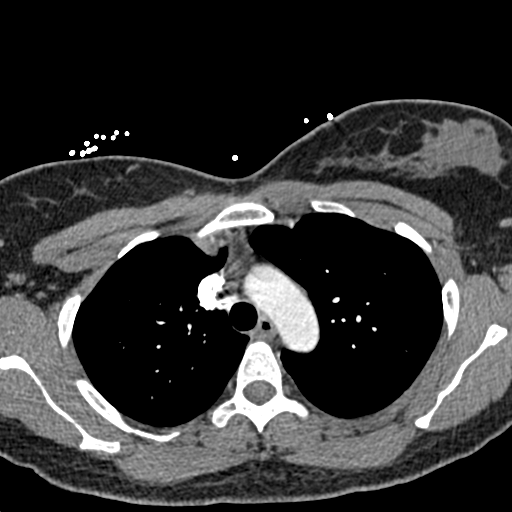
[im 262/318  lung]
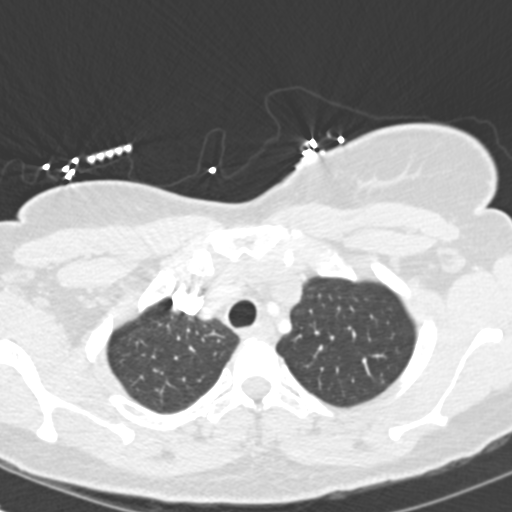
[im 276/318  soft-tissue]
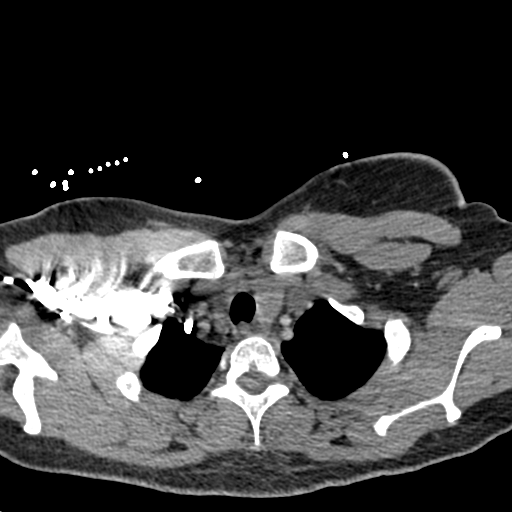
[im 304/318  lung]
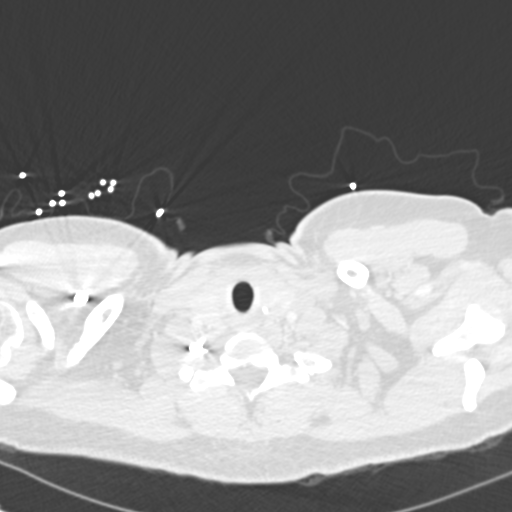

[Series 6: coronal mpr · coronal · 0.54mm/px · 3 of 114 slices shown]
[im 29/114  soft-tissue]
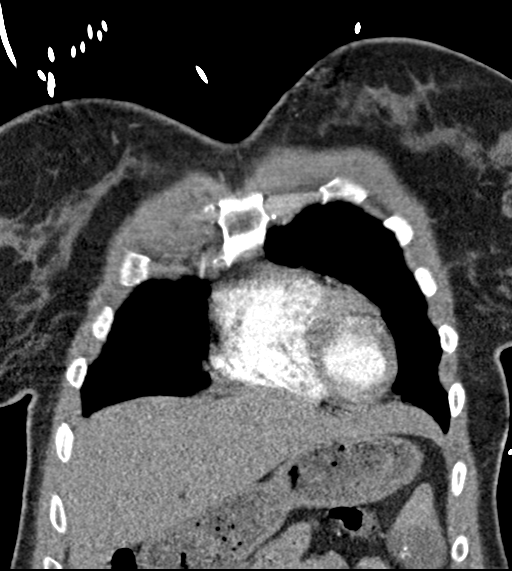
[im 57/114  soft-tissue]
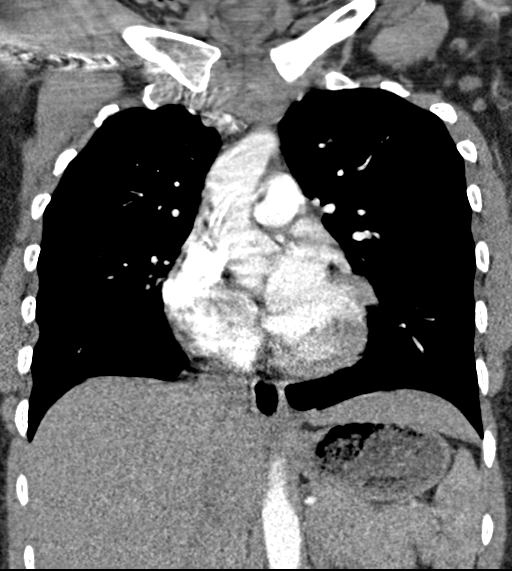
[im 85/114  soft-tissue]
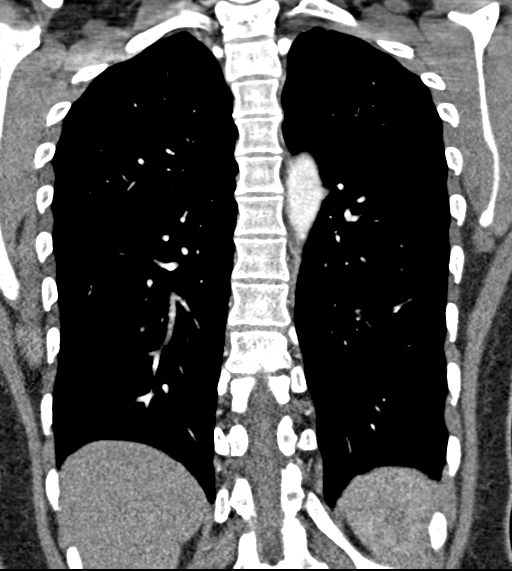

[18 of 46 positions shown; findings below may reference images not displayed]

FINDINGS: Cardiovascular: Thoracic aorta and its branches are within normal
limits. No atherosclerotic changes or aneurysmal dilatation is
noted. No dissection is seen. No cardiac enlargement is noted.
Pulmonary artery shows a normal branching pattern. No filling defect
is identified to suggest pulmonary embolism.

Mediastinum/Nodes: Thoracic inlet is within normal limits. No hilar
or mediastinal adenopathy is noted. The esophagus is within normal
limits.

Lungs/Pleura: Lungs are well aerated bilaterally. No focal
infiltrate or sizable effusion is noted.

Upper Abdomen: Visualized upper abdomen demonstrates a cystic lesion
within the anterior aspect of the spleen measuring 5.7 cm in
greatest dimension. Some peripheral calcifications are noted. No
other focal abnormality is noted.

Musculoskeletal: No chest wall abnormality. No acute or significant
osseous findings.

Review of the MIP images confirms the above findings.
IMPRESSION: No evidence of pulmonary emboli.

Lungs are clear bilaterally.

Cystic lesion within the spleen with peripheral calcifications
likely representing a mildly complicated cyst.

## 2019-08-22 IMAGING — CR CHEST - 2 VIEW
2 series · 2 of 2 positions shown · non-contrast
Comparison: None.

CLINICAL DATA: Chest pain

EXAM:
CHEST - 2 VIEW

[w chest pa]
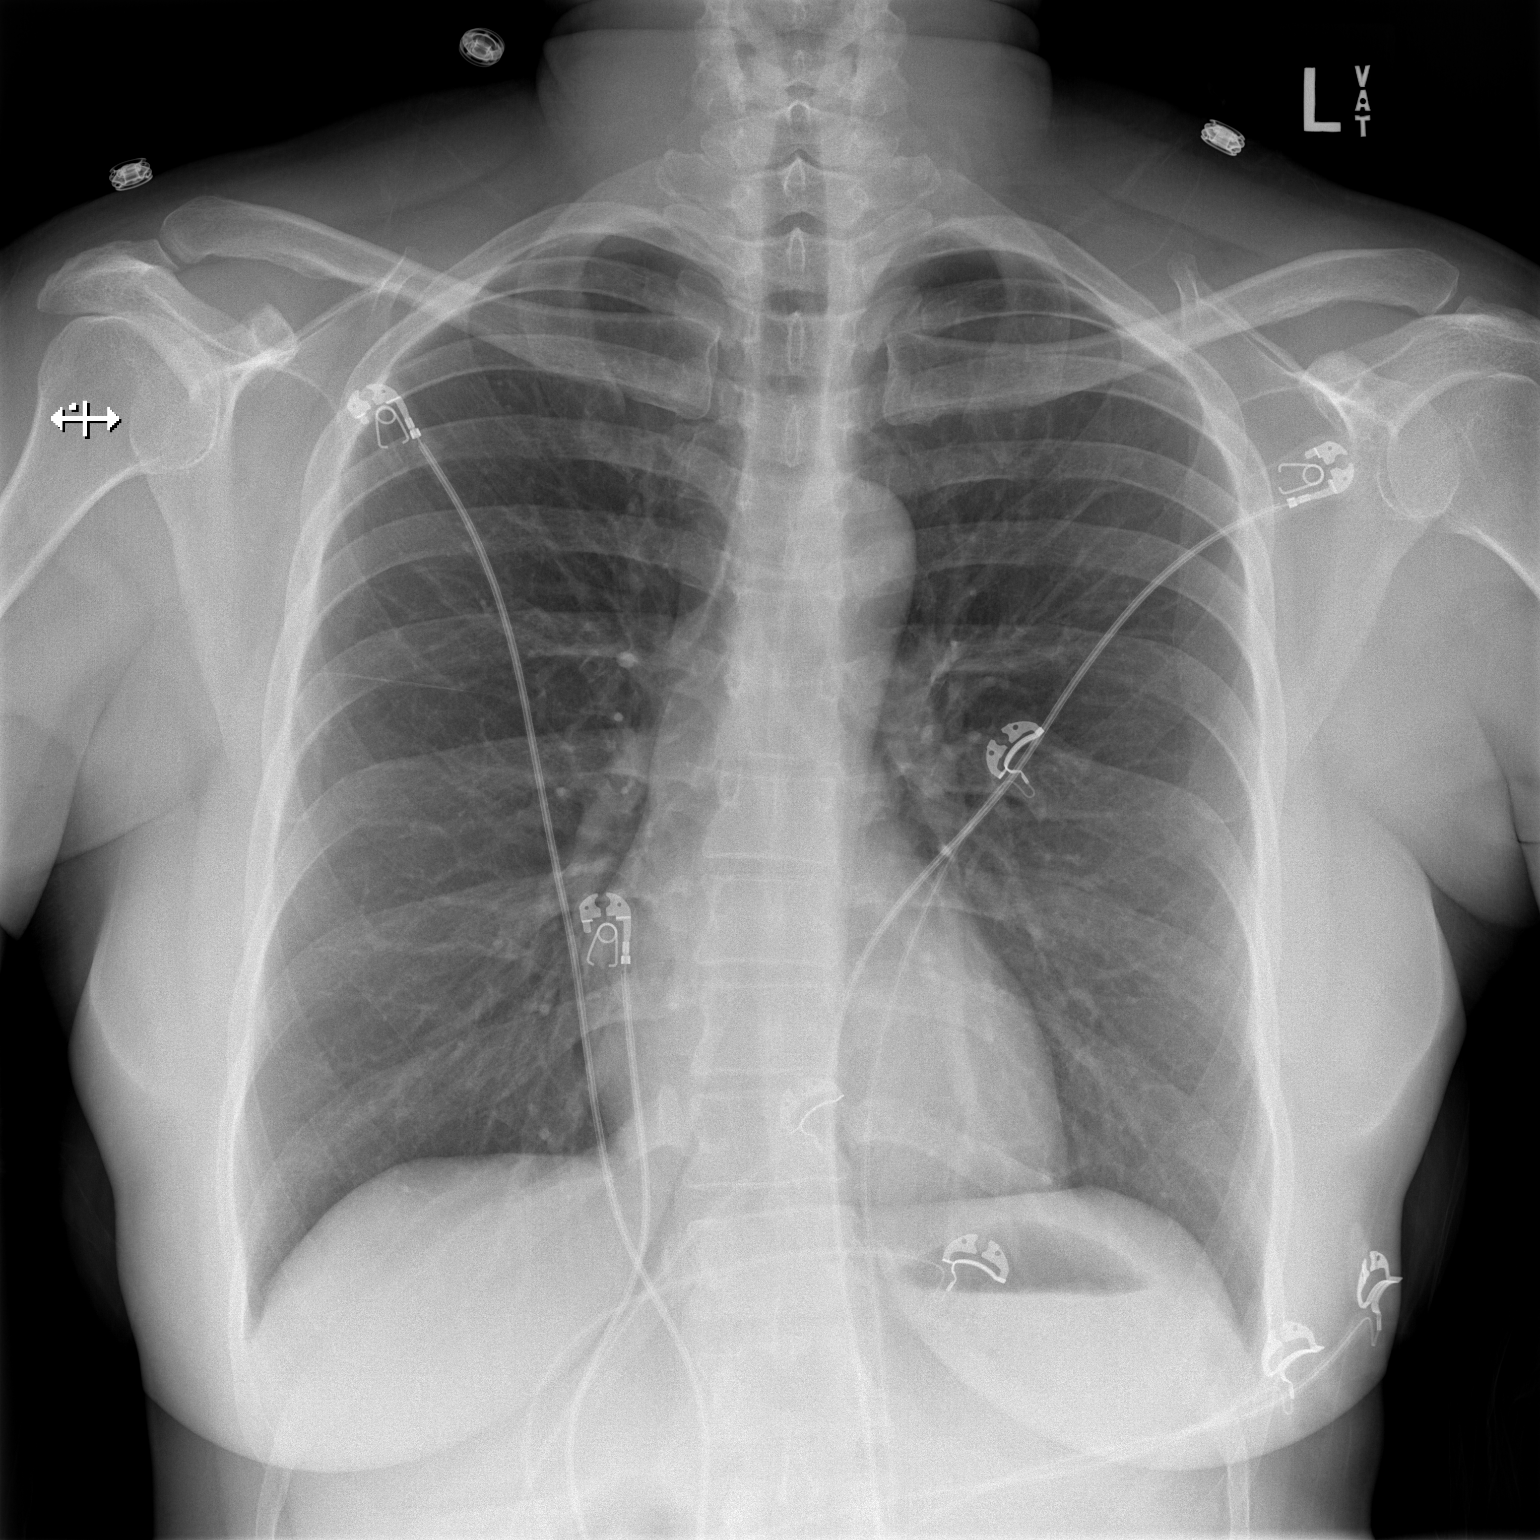

[w chest lat]
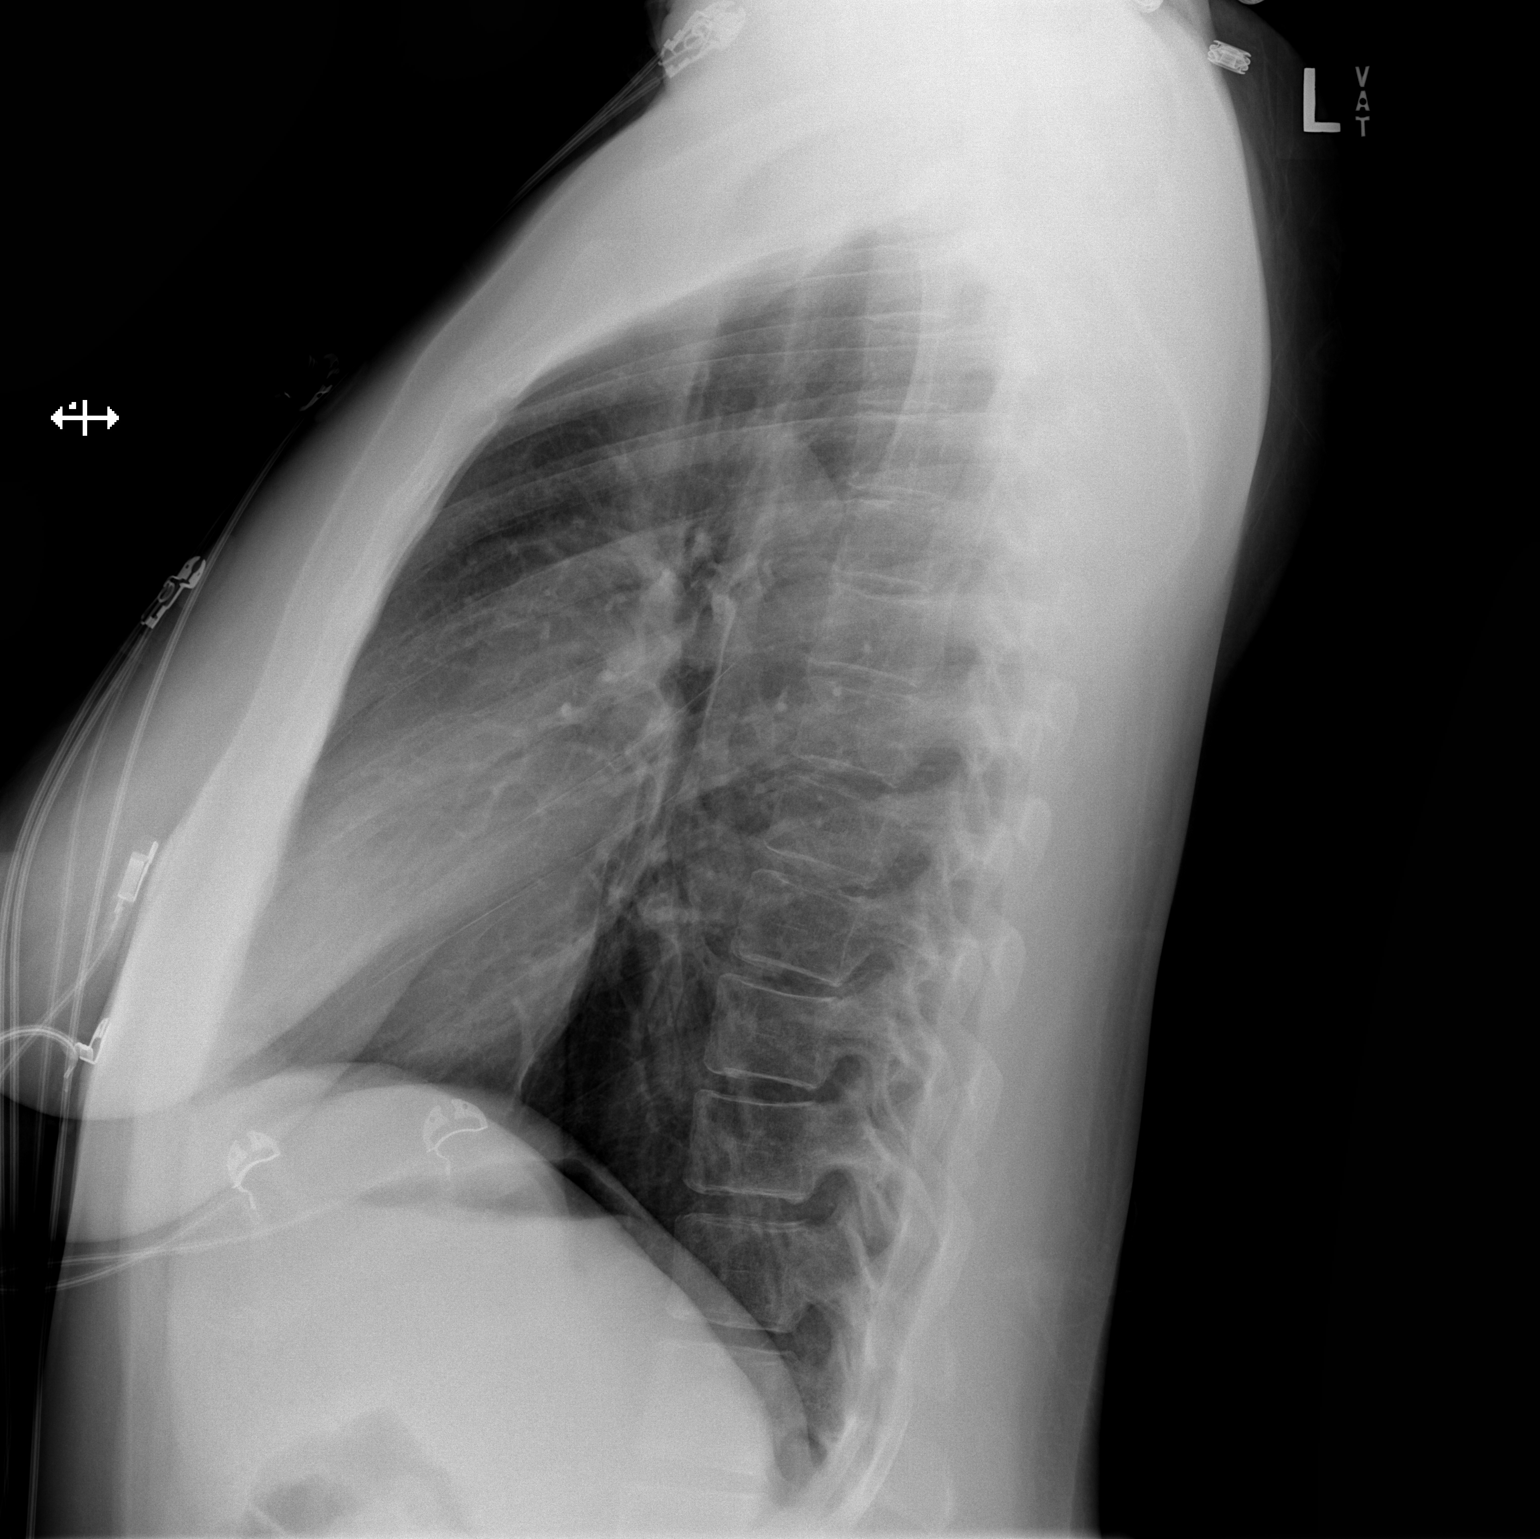

[2 of 2 positions shown; findings below may reference images not displayed]

FINDINGS: Lungs are clear. Heart size and pulmonary vascularity are normal. No
adenopathy. No pneumothorax. There is slight upper lumbar
levoscoliosis.
IMPRESSION: No edema or consolidation.  Heart size within normal limits.

## 2020-05-23 ENCOUNTER — Other Ambulatory Visit: Payer: Self-pay | Admitting: *Deleted

## 2020-05-23 ENCOUNTER — Other Ambulatory Visit: Payer: No Typology Code available for payment source

## 2020-05-23 DIAGNOSIS — Z20822 Contact with and (suspected) exposure to covid-19: Secondary | ICD-10-CM

## 2020-05-26 LAB — NOVEL CORONAVIRUS, NAA: SARS-CoV-2, NAA: NOT DETECTED

## 2023-12-06 DIAGNOSIS — S0502XA Injury of conjunctiva and corneal abrasion without foreign body, left eye, initial encounter: Secondary | ICD-10-CM | POA: Diagnosis not present

## 2023-12-12 DIAGNOSIS — H0015 Chalazion left lower eyelid: Secondary | ICD-10-CM | POA: Diagnosis not present

## 2023-12-12 DIAGNOSIS — S0502XD Injury of conjunctiva and corneal abrasion without foreign body, left eye, subsequent encounter: Secondary | ICD-10-CM | POA: Diagnosis not present
